# Patient Record
Sex: Male | Born: 1955 | Race: White | Hispanic: No | Marital: Married | Smoking: Never smoker
Health system: Southern US, Community
[De-identification: ages and names within clinical notes are randomized; demographics above are authoritative.]

## PROBLEM LIST (undated history)

## (undated) DIAGNOSIS — E7800 Pure hypercholesterolemia, unspecified: Secondary | ICD-10-CM

## (undated) DIAGNOSIS — I2510 Atherosclerotic heart disease of native coronary artery without angina pectoris: Secondary | ICD-10-CM

---

## 2001-01-18 ENCOUNTER — Inpatient Hospital Stay (HOSPITAL_COMMUNITY): Admission: EM | Admit: 2001-01-18 | Discharge: 2001-01-21 | Payer: Self-pay | Admitting: Emergency Medicine

## 2004-12-18 ENCOUNTER — Emergency Department (HOSPITAL_COMMUNITY): Admission: EM | Admit: 2004-12-18 | Discharge: 2004-12-18 | Payer: Self-pay | Admitting: Emergency Medicine

## 2019-04-10 ENCOUNTER — Emergency Department (HOSPITAL_BASED_OUTPATIENT_CLINIC_OR_DEPARTMENT_OTHER): Payer: PRIVATE HEALTH INSURANCE

## 2019-04-10 ENCOUNTER — Emergency Department (HOSPITAL_BASED_OUTPATIENT_CLINIC_OR_DEPARTMENT_OTHER)
Admission: EM | Admit: 2019-04-10 | Discharge: 2019-04-10 | Disposition: A | Discharge status: Home / Self Care | Payer: PRIVATE HEALTH INSURANCE | Attending: Emergency Medicine | Admitting: Emergency Medicine

## 2019-04-10 ENCOUNTER — Encounter (HOSPITAL_BASED_OUTPATIENT_CLINIC_OR_DEPARTMENT_OTHER): Payer: Self-pay | Admitting: *Deleted

## 2019-04-10 ENCOUNTER — Other Ambulatory Visit: Payer: Self-pay

## 2019-04-10 DIAGNOSIS — E782 Mixed hyperlipidemia: Secondary | ICD-10-CM | POA: Insufficient documentation

## 2019-04-10 DIAGNOSIS — S20211A Contusion of right front wall of thorax, initial encounter: Secondary | ICD-10-CM

## 2019-04-10 DIAGNOSIS — Y9301 Activity, walking, marching and hiking: Secondary | ICD-10-CM | POA: Insufficient documentation

## 2019-04-10 DIAGNOSIS — Y929 Unspecified place or not applicable: Secondary | ICD-10-CM | POA: Insufficient documentation

## 2019-04-10 DIAGNOSIS — Y999 Unspecified external cause status: Secondary | ICD-10-CM | POA: Diagnosis not present

## 2019-04-10 DIAGNOSIS — S299XXA Unspecified injury of thorax, initial encounter: Secondary | ICD-10-CM | POA: Diagnosis present

## 2019-04-10 DIAGNOSIS — W010XXA Fall on same level from slipping, tripping and stumbling without subsequent striking against object, initial encounter: Secondary | ICD-10-CM | POA: Diagnosis not present

## 2019-04-10 DIAGNOSIS — I2510 Atherosclerotic heart disease of native coronary artery without angina pectoris: Secondary | ICD-10-CM | POA: Insufficient documentation

## 2019-04-10 DIAGNOSIS — W19XXXA Unspecified fall, initial encounter: Secondary | ICD-10-CM

## 2019-04-10 HISTORY — DX: Atherosclerotic heart disease of native coronary artery without angina pectoris: I25.10

## 2019-04-10 HISTORY — DX: Pure hypercholesterolemia, unspecified: E78.00

## 2019-04-10 MED ORDER — DICLOFENAC SODIUM 1 % EX GEL: 2.0000 g | Freq: Four times a day (QID) | CUTANEOUS | 0 refills | Status: AC | PRN

## 2019-04-10 NOTE — ED Notes (Signed)
Pt was seen and assessed by the MD  - the RN noted no distress, Pt is a/o x 4

## 2019-04-10 NOTE — Discharge Instructions (Signed)

## 2019-04-10 NOTE — ED Provider Notes (Signed)
Emergency Department Provider Note   I have reviewed the triage vital signs and the nursing notes.   HISTORY  Chief Complaint Fall   HPI Todd Mejia is a 63 y.o. male presents to the emergency department for evaluation after fall.  Patient reports tripping and falling 2 days ago with persistent pain in his right lower ribs and abdomen.  Patient denies syncope, head injury, chest pain, palpitations, shortness of breath prior to or during fall.  Patient is not anticoagulated.  He is not experiencing weakness or numbness.  With persistent pain he presents for evaluation of possible occult injury.   Past Medical History:  Diagnosis Date  . Coronary artery disease   . High cholesterol     There are no problems to display for this patient.   Past Surgical History:  Procedure Laterality Date  . CORONARY ARTERY BYPASS GRAFT      Allergies Patient has no known allergies.  No family history on file.  Social History Social History   Tobacco Use  . Smoking status: Never Smoker  . Smokeless tobacco: Never Used  Substance Use Topics  . Alcohol use: Not Currently  . Drug use: Never    Review of Systems  Constitutional: No fever/chills Cardiovascular: Denies chest pain. Respiratory: Denies shortness of breath. Gastrointestinal: No abdominal pain.   Musculoskeletal: Negative for back pain. Positive right chest wall pain.  Skin: Negative for rash. Neurological: Negative for headaches, focal weakness or numbness.  10-point ROS otherwise negative.  ____________________________________________   PHYSICAL EXAM:  VITAL SIGNS: ED Triage Vitals  Enc Vitals Group     BP 04/10/19 1649 134/89     Pulse Rate 04/10/19 1649 96     Resp 04/10/19 1649 17     Temp 04/10/19 1649 98.6 F (37 C)     Temp Source 04/10/19 1649 Oral     SpO2 04/10/19 1649 95 %     Weight 04/10/19 1639 255 lb (115.7 kg)     Height 04/10/19 1639 5\' 11"  (1.803 m)   Constitutional: Alert and  oriented. Well appearing and in no acute distress. Eyes: Conjunctivae are normal.  Head: Atraumatic. Nose: No congestion/rhinnorhea. Mouth/Throat: Mucous membranes are moist.  Neck: No stridor. No cervical spine tenderness to palpation. Cardiovascular: Normal rate, regular rhythm. Good peripheral circulation. Grossly normal heart sounds.   Respiratory: Normal respiratory effort.  No retractions. Lungs CTAB. Gastrointestinal: Soft and nontender. No distention. No flank bruising.  Musculoskeletal: No lower extremity tenderness nor edema. No gross deformities of extremities. Tenderness to palpation of the right lateral chest wall without crepitus, bruising, paradoxical movement. Neurologic:  Normal speech and language.  Skin:  Skin is warm, dry and intact. No rash noted.  ____________________________________________  RADIOLOGY  DG Ribs Unilateral W/Chest Right  Result Date: 04/10/2019 CLINICAL DATA:  Trip and fall 2 days ago with right-sided chest pain, initial encounter EXAM: RIGHT RIBS AND CHEST - 3+ VIEW COMPARISON:  None. FINDINGS: Cardiac shadow is at the upper limits of normal in size. Postsurgical changes are noted. Mild aortic calcifications are seen. Mild bibasilar atelectasis is noted likely related to a poor inspiratory effort. No pneumothorax is seen. No acute rib fracture is seen. IMPRESSION: No acute rib fracture noted. Electronically Signed   By: 14/01/2019 M.D.   On: 04/10/2019 16:57    ____________________________________________   PROCEDURES  Procedure(s) performed:   Procedures  None ____________________________________________   INITIAL IMPRESSION / ASSESSMENT AND PLAN / ED COURSE  Pertinent labs &  imaging results that were available during my care of the patient were reviewed by me and considered in my medical decision making (see chart for details).   Patient presents to the emergency department for evaluation after mechanical fall 2 days ago.  He has  tenderness over the right chest wall.  No focal abdominal tenderness or flank bruising to require advanced imaging of the abdomen.  Plain films of the right ribs and chest reviewed with no obvious fracture, pneumothorax, other acute finding.  Plan for symptom management at home and PCP follow-up if symptoms persist.   ____________________________________________  FINAL CLINICAL IMPRESSION(S) / ED DIAGNOSES  Final diagnoses:  Contusion of right chest wall, initial encounter  Fall, initial encounter     NEW OUTPATIENT MEDICATIONS STARTED DURING THIS VISIT:  Discharge Medication List as of 04/10/2019  6:13 PM    START taking these medications   Details  diclofenac Sodium (VOLTAREN) 1 % GEL Apply 2 g topically 4 (four) times daily as needed (pain)., Starting Thu 04/10/2019, Print        Note:  This document was prepared using Dragon voice recognition software and may include unintentional dictation errors.  Nanda Quinton, MD, [facility]    Afrika Brick, [facility], MD 04/11/19 [phone]

## 2019-04-10 NOTE — ED Triage Notes (Signed)
He tripped and fell 2 days ago. C.o pain in his right ribs and abdomen.

## 2021-07-01 IMAGING — CR DG RIBS W/ CHEST 3+V*R*
4 series · 4 of 4 positions shown · non-contrast
Comparison: None.

CLINICAL DATA: Trip and fall 2 days ago with right-sided chest
pain, initial encounter

EXAM:
RIGHT RIBS AND CHEST - 3+ VIEW

[w chest pa]
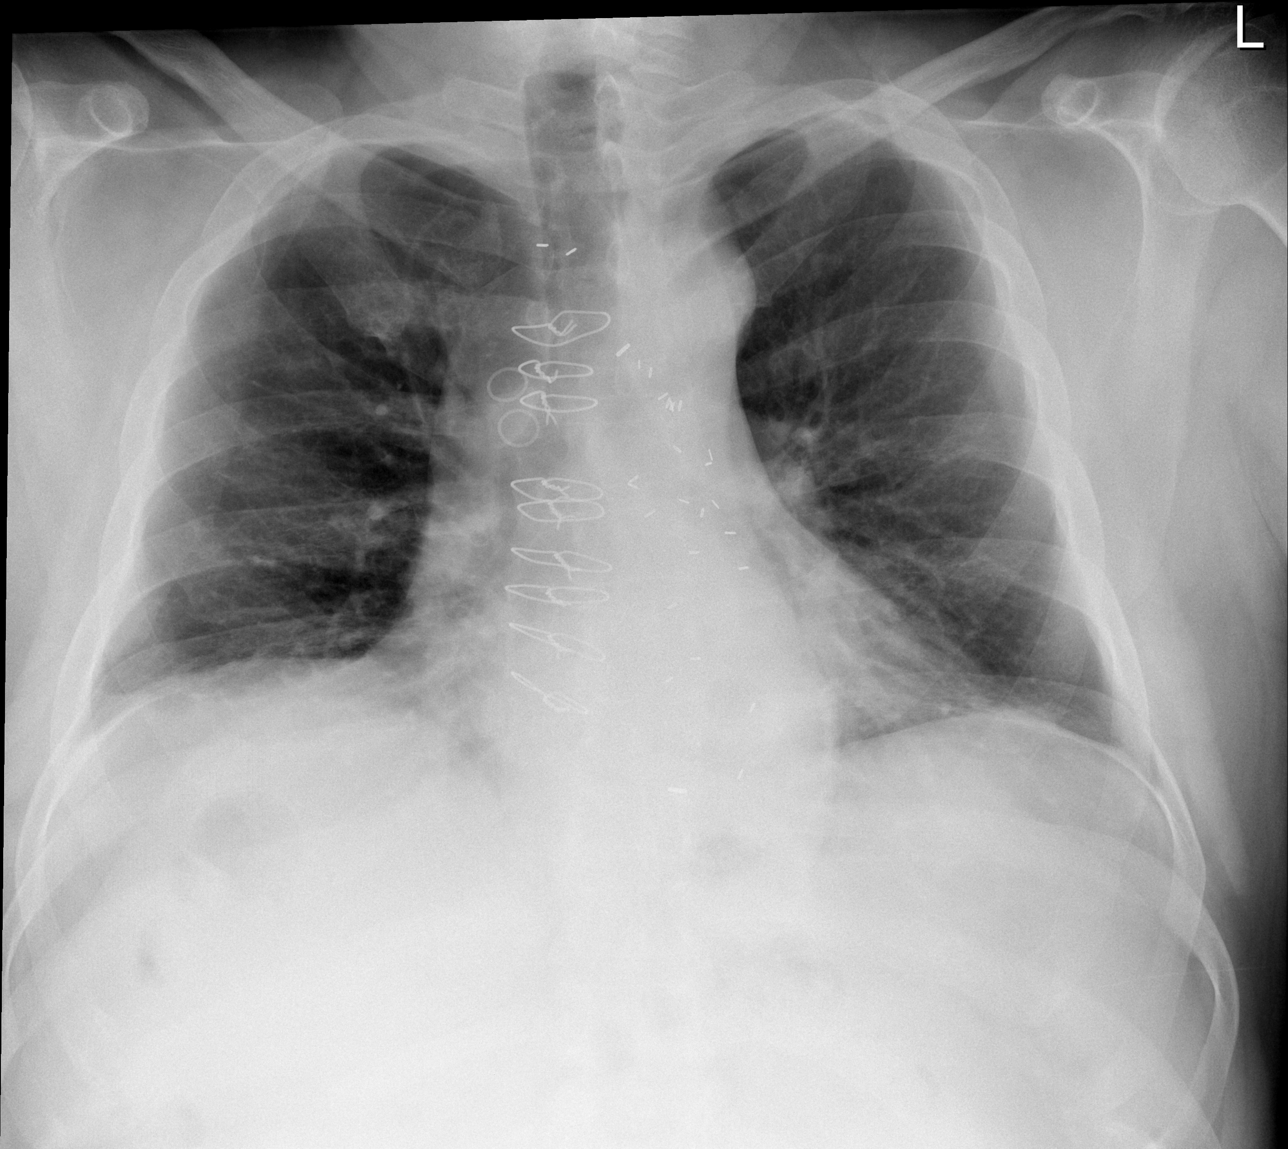

[w ribs ap/pa upper right *]
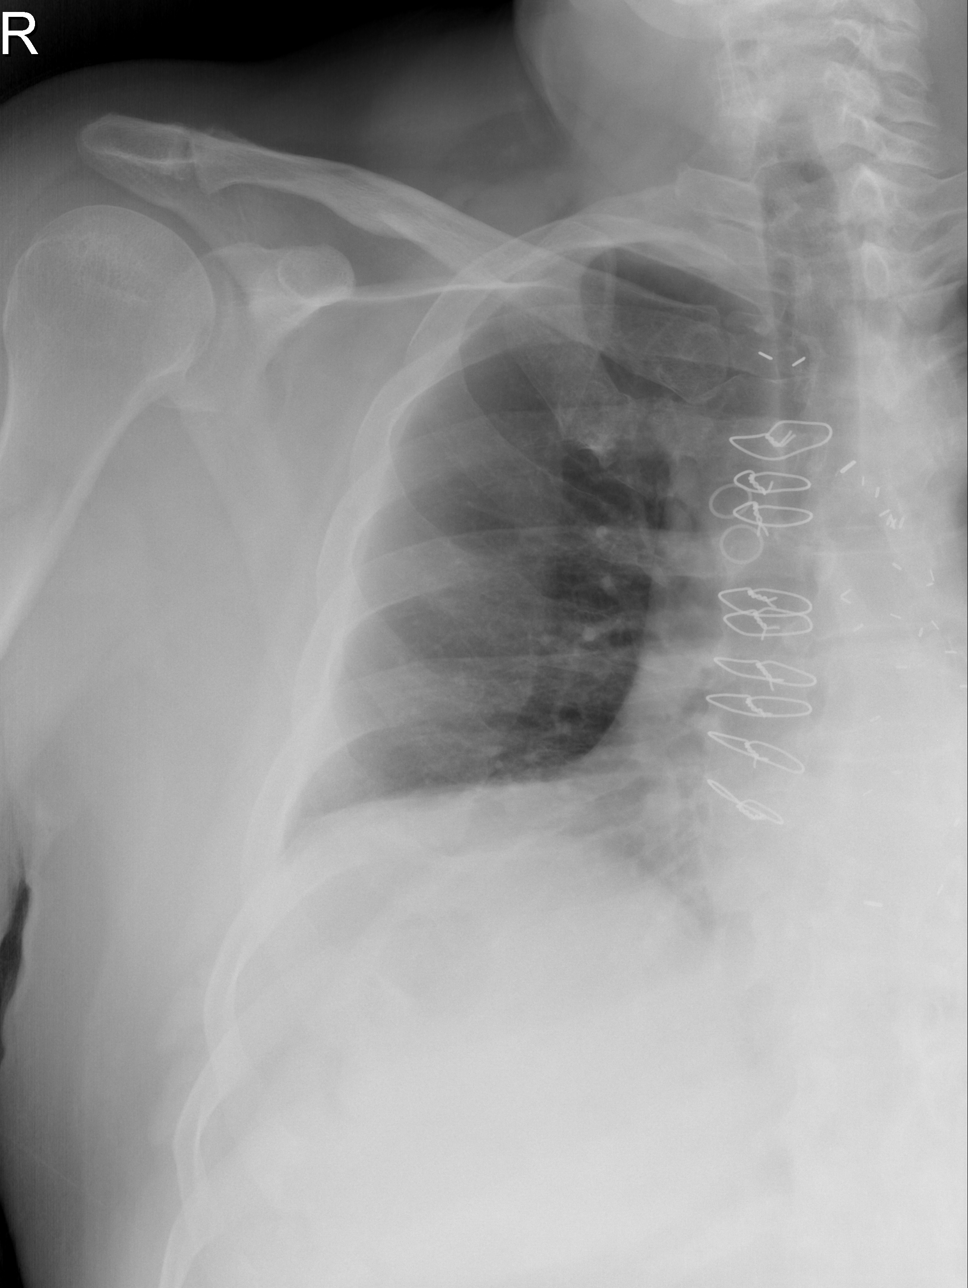

[w ribs ap/pa lower right]
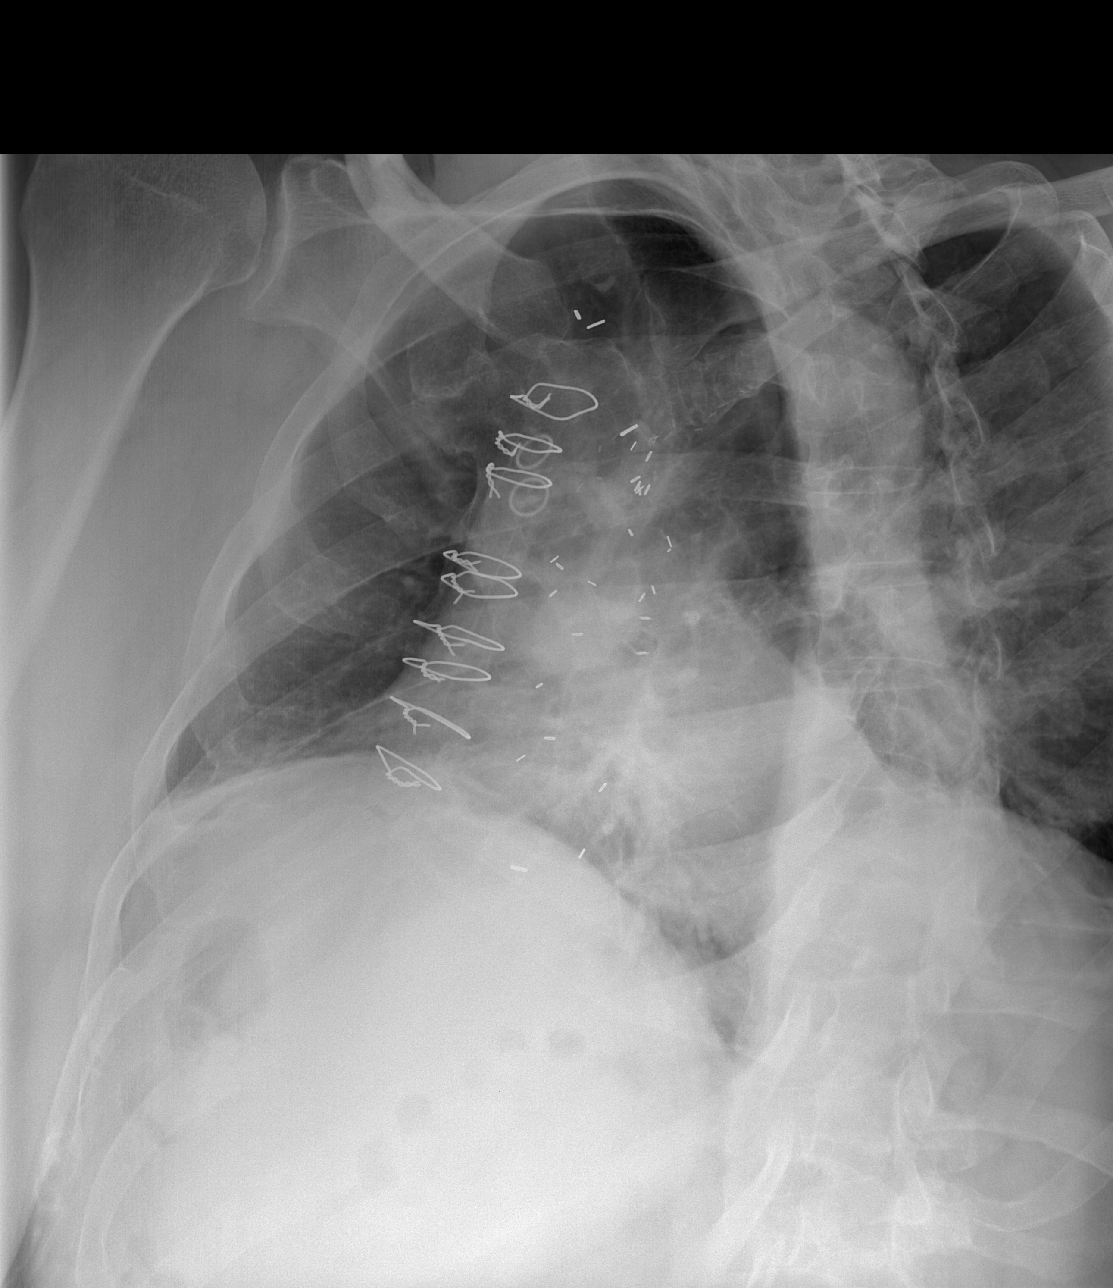

[w ribs oblique right *]
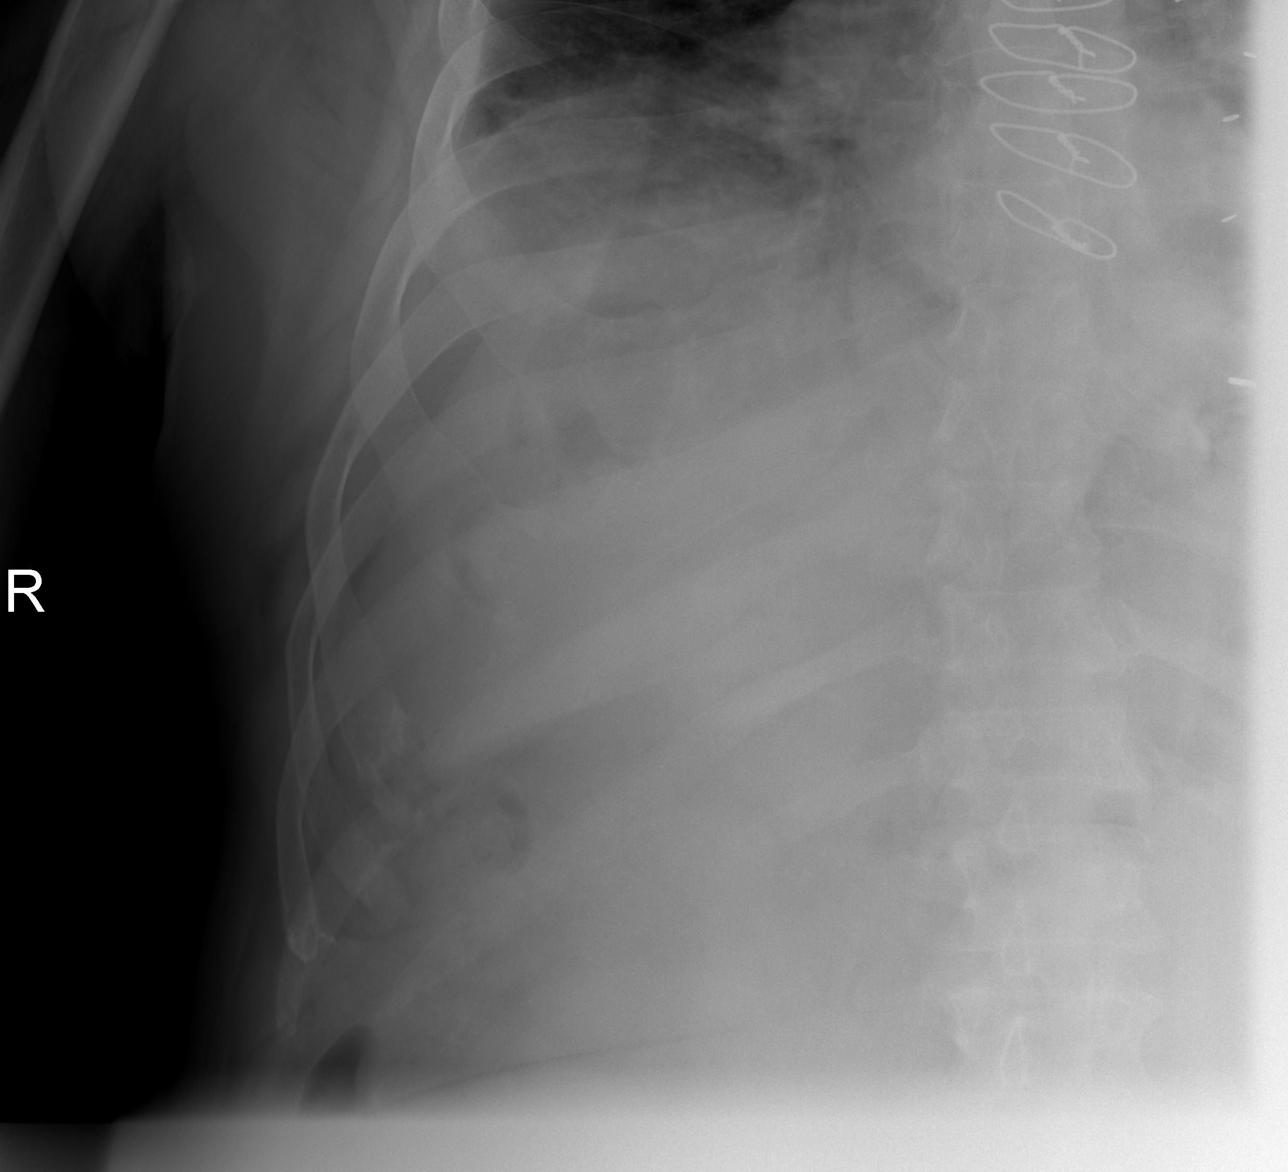

[4 of 4 positions shown; findings below may reference images not displayed]

FINDINGS: Cardiac shadow is at the upper limits of normal in size.
Postsurgical changes are noted. Mild aortic calcifications are seen.
Mild bibasilar atelectasis is noted likely related to a poor
inspiratory effort. No pneumothorax is seen. No acute rib fracture
is seen.
IMPRESSION: No acute rib fracture noted.
